# Patient Record
Sex: Male | Born: 1990 | Race: White | Hispanic: No | Marital: Married | Smoking: Never smoker
Health system: Southern US, Community
[De-identification: ages and names within clinical notes are randomized; demographics above are authoritative.]

## PROBLEM LIST (undated history)

## (undated) DIAGNOSIS — N200 Calculus of kidney: Secondary | ICD-10-CM

## (undated) DIAGNOSIS — J45909 Unspecified asthma, uncomplicated: Secondary | ICD-10-CM

## (undated) HISTORY — PX: NO PAST SURGERIES: SHX2092

## (undated) HISTORY — DX: Calculus of kidney: N20.0

---

## 2012-04-16 ENCOUNTER — Emergency Department: Payer: Self-pay | Admitting: Emergency Medicine

## 2012-04-17 LAB — CBC WITH DIFFERENTIAL/PLATELET
Basophil #: 0.1 10*3/uL (ref 0.0–0.1)
Eosinophil #: 0.4 10*3/uL (ref 0.0–0.7)
HCT: 47.1 % (ref 40.0–52.0)
HGB: 16.3 g/dL (ref 13.0–18.0)
Lymphocyte %: 28 %
MCHC: 34.7 g/dL (ref 32.0–36.0)
Neutrophil %: 57 %
RBC: 5.37 10*6/uL (ref 4.40–5.90)
RDW: 13.2 % (ref 11.5–14.5)

## 2012-04-17 LAB — BASIC METABOLIC PANEL
Anion Gap: 8 (ref 7–16)
BUN: 17 mg/dL (ref 7–18)
Calcium, Total: 8.8 mg/dL (ref 8.5–10.1)
Co2: 29 mmol/L (ref 21–32)
Glucose: 83 mg/dL (ref 65–99)
Osmolality: 286 (ref 275–301)
Potassium: 3.7 mmol/L (ref 3.5–5.1)

## 2012-04-23 LAB — CULTURE, BLOOD (SINGLE)

## 2016-08-14 ENCOUNTER — Ambulatory Visit
Admission: EM | Admit: 2016-08-14 | Discharge: 2016-08-14 | Disposition: A | Discharge status: Home / Self Care | Payer: Commercial Managed Care - PPO | Attending: Family Medicine | Admitting: Family Medicine

## 2016-08-14 ENCOUNTER — Encounter: Payer: Self-pay | Admitting: *Deleted

## 2016-08-14 DIAGNOSIS — R0789 Other chest pain: Secondary | ICD-10-CM | POA: Diagnosis not present

## 2016-08-14 DIAGNOSIS — J4520 Mild intermittent asthma, uncomplicated: Secondary | ICD-10-CM | POA: Insufficient documentation

## 2016-08-14 DIAGNOSIS — R079 Chest pain, unspecified: Secondary | ICD-10-CM | POA: Diagnosis present

## 2016-08-14 DIAGNOSIS — M940 Chondrocostal junction syndrome [Tietze]: Secondary | ICD-10-CM | POA: Insufficient documentation

## 2016-08-14 DIAGNOSIS — I10 Essential (primary) hypertension: Secondary | ICD-10-CM | POA: Insufficient documentation

## 2016-08-14 HISTORY — DX: Unspecified asthma, uncomplicated: J45.909

## 2016-08-14 MED ORDER — ALBUTEROL SULFATE HFA 108 (90 BASE) MCG/ACT IN AERS: 1.0000 | INHALATION_SPRAY | Freq: Four times a day (QID) | RESPIRATORY_TRACT | 0 refills | Status: AC | PRN

## 2016-08-14 NOTE — ED Triage Notes (Signed)
Sudden onset left chest pain, sharp in nature, non-radiating, dyspnea, and weakness. BP was checked at work and was 150/90. Onset of pain was yesterday while at work and has continued today.

## 2016-08-14 NOTE — ED Provider Notes (Signed)
[facility]    CSN: [id number] Arrival date & time: 08/14/16  1235     History   Chief Complaint Chief Complaint  Patient presents with  . Chest Pain  . Hypertension    HPI Tyler Holmes is a 26 y.o. male.   The history is provided by the patient.  Chest Pain  Pain location:  L chest Pain quality: sharp and stabbing   Pain radiates to:  Does not radiate Pain severity:  Mild Onset quality:  Sudden Duration:  10 minutes Timing:  Sporadic Progression:  Resolved Chronicity:  New Context: lifting, movement and stress   Context: not breathing and not trauma   Context comment:  Episode while working; "sharp", "stabbing" brief chest pain Relieved by:  None tried Ineffective treatments:  Rest Associated symptoms: no abdominal pain, no AICD problem, no altered mental status, no anorexia, no anxiety, no back pain, no claudication, no cough, no diaphoresis, no dizziness, no dysphagia, no fatigue, no fever, no headache, no heartburn, no lower extremity edema, no nausea, no near-syncope, no numbness, no orthopnea, no palpitations, no PND, no shortness of breath, no syncope, no vomiting and no weakness   Risk factors: no aortic disease, no birth control, no coronary artery disease, no diabetes mellitus, no high cholesterol, no hypertension and no immobilization   Risk factors comment:  Mild intermittent asthma Hypertension  Associated symptoms include chest pain. Pertinent negatives include no abdominal pain, no headaches and no shortness of breath.    Past Medical History:  Diagnosis Date  . Asthma     There are no active problems to display for this patient.   History reviewed. No pertinent surgical history.     Home Medications    Prior to Admission medications   Medication Sig Start Date End Date Taking? Authorizing Provider  albuterol (PROVENTIL HFA;VENTOLIN HFA) 108 (90 Base) MCG/ACT inhaler Inhale 1-2 puffs into the lungs every 6 (six) hours as needed  for wheezing or shortness of breath. 08/14/16   Payton Mccallum, MD    Family History History reviewed. No pertinent family history.  Social History Social History  Substance Use Topics  . Smoking status: Never Smoker  . Smokeless tobacco: Never Used  . Alcohol use No     Allergies   Erythromycin   Review of Systems Review of Systems  Constitutional: Negative for diaphoresis, fatigue and fever.  HENT: Negative for trouble swallowing.   Respiratory: Negative for cough and shortness of breath.   Cardiovascular: Positive for chest pain. Negative for palpitations, orthopnea, claudication, syncope, PND and near-syncope.  Gastrointestinal: Negative for abdominal pain, anorexia, heartburn, nausea and vomiting.  Musculoskeletal: Negative for back pain.  Neurological: Negative for dizziness, weakness, numbness and headaches.     Physical Exam Triage Vital Signs ED Triage Vitals  Enc Vitals Group     BP 08/14/16 1309 107/67     Pulse Rate 08/14/16 1309 73     Resp 08/14/16 1309 16     Temp 08/14/16 1309 97.5 F (36.4 C)     Temp Source 08/14/16 1309 Oral     SpO2 08/14/16 1309 100 %     Weight 08/14/16 1311 150 lb (68 kg)     Height 08/14/16 1311 6\' 1"  (1.854 m)     Head Circumference --      Peak Flow --      Pain Score 08/14/16 1400 4     Pain Loc --      Pain Edu? --  Excl. in GC? --    No data found.   Updated Vital Signs BP 107/67 (BP Location: Left Arm)   Pulse 73   Temp 97.5 F (36.4 C) (Oral)   Resp 16   Ht 6\' 1"  (1.854 m)   Wt 150 lb (68 kg)   SpO2 100%   BMI 19.79 kg/m   Visual Acuity Right Eye Distance:   Left Eye Distance:   Bilateral Distance:    Right Eye Near:   Left Eye Near:    Bilateral Near:     Physical Exam  Constitutional: He appears well-developed and well-nourished. No distress.  HENT:  Head: Normocephalic and atraumatic.  Right Ear: Tympanic membrane, external ear and ear canal normal.  Left Ear: Tympanic membrane,  external ear and ear canal normal.  Nose: Nose normal.  Mouth/Throat: Uvula is midline, oropharynx is clear and moist and mucous membranes are normal. No oropharyngeal exudate or tonsillar abscesses.  Eyes: Conjunctivae and EOM are normal. Pupils are equal, round, and reactive to light. Right eye exhibits no discharge. Left eye exhibits no discharge. No scleral icterus.  Neck: Normal range of motion. Neck supple. No tracheal deviation present. No thyromegaly present.  Cardiovascular: Normal rate, regular rhythm, normal heart sounds and intact distal pulses.   Pulmonary/Chest: Effort normal and breath sounds normal. No stridor. No respiratory distress. He has no wheezes. He has no rales. He exhibits no tenderness.  Lymphadenopathy:    He has no cervical adenopathy.  Neurological: He is alert.  Skin: Skin is warm and dry. No rash noted. He is not diaphoretic.  Nursing note and vitals reviewed.    UC Treatments / Results  Labs (all labs ordered are listed, but only abnormal results are displayed) Labs Reviewed - No data to display  EKG  EKG Interpretation None       Radiology No results found.  Procedures .EKG Date/Time: 08/14/2016 3:37 PM Performed by: Payton Mccallum Authorized by: Payton Mccallum   ECG reviewed by ED Physician in the absence of a cardiologist: yes   Previous ECG:    Previous ECG:  Unavailable Interpretation:    Interpretation: normal   Rate:    ECG rate assessment: normal   Rhythm:    Rhythm: sinus rhythm   Ectopy:    Ectopy: none   QRS:    QRS axis:  Normal Conduction:    Conduction: normal   ST segments:    ST segments:  Normal T waves:    T waves: normal     (including critical care time)  Medications Ordered in UC Medications - No data to display   Initial Impression / Assessment and Plan / UC Course  I have reviewed the triage vital signs and the nursing notes.  Pertinent labs & imaging results that were available during my care of  the patient were reviewed by me and considered in my medical decision making (see chart for details).      Final Clinical Impressions(s) / UC Diagnoses   Final diagnoses:  Atypical chest pain  Mild intermittent asthma without complication  Costochondritis    New Prescriptions Discharge Medication List as of 08/14/2016  1:58 PM    START taking these medications   Details  albuterol (PROVENTIL HFA;VENTOLIN HFA) 108 (90 Base) MCG/ACT inhaler Inhale 1-2 puffs into the lungs every 6 (six) hours as needed for wheezing or shortness of breath., Starting Fri 08/14/2016, Normal       1. ekg result (normal) and diagnosis reviewed with  patient 2. rx as per orders above; reviewed possible side effects, interactions, risks and benefits  3. Recommend supportive treatment with otc analgesics prn  4. Follow-up prn if symptoms worsen or don't improve   Payton Mccallumrlando Ivone Licht, MD 08/14/16 1542

## 2017-03-11 ENCOUNTER — Encounter: Payer: Self-pay | Admitting: Emergency Medicine

## 2017-03-11 ENCOUNTER — Emergency Department: Payer: Commercial Managed Care - PPO

## 2017-03-11 ENCOUNTER — Emergency Department
Admission: EM | Admit: 2017-03-11 | Discharge: 2017-03-11 | Disposition: A | Discharge status: Home / Self Care | Payer: Commercial Managed Care - PPO | Attending: Emergency Medicine | Admitting: Emergency Medicine

## 2017-03-11 DIAGNOSIS — N200 Calculus of kidney: Secondary | ICD-10-CM | POA: Diagnosis not present

## 2017-03-11 DIAGNOSIS — J45909 Unspecified asthma, uncomplicated: Secondary | ICD-10-CM | POA: Diagnosis not present

## 2017-03-11 DIAGNOSIS — R112 Nausea with vomiting, unspecified: Secondary | ICD-10-CM | POA: Insufficient documentation

## 2017-03-11 DIAGNOSIS — R1031 Right lower quadrant pain: Secondary | ICD-10-CM | POA: Diagnosis present

## 2017-03-11 LAB — COMPREHENSIVE METABOLIC PANEL
ALT: 18 U/L (ref 17–63)
AST: 22 U/L (ref 15–41)
Albumin: 4.3 g/dL (ref 3.5–5.0)
Alkaline Phosphatase: 38 U/L (ref 38–126)
Anion gap: 8 (ref 5–15)
BILIRUBIN TOTAL: 0.6 mg/dL (ref 0.3–1.2)
BUN: 21 mg/dL — AB (ref 6–20)
CHLORIDE: 107 mmol/L (ref 101–111)
CO2: 24 mmol/L (ref 22–32)
CREATININE: 1.18 mg/dL (ref 0.61–1.24)
Calcium: 9.1 mg/dL (ref 8.9–10.3)
GFR calc Af Amer: >60 mL/min (ref >60)
Glucose, Bld: 149 mg/dL — ABNORMAL HIGH (ref 65–99)
Potassium: 3.6 mmol/L (ref 3.5–5.1)
Sodium: 139 mmol/L (ref 135–145)
Total Protein: 7.1 g/dL (ref 6.5–8.1)

## 2017-03-11 LAB — URINALYSIS, COMPLETE (UACMP) WITH MICROSCOPIC
BACTERIA UA: NONE SEEN
BILIRUBIN URINE: NEGATIVE
Glucose, UA: NEGATIVE mg/dL
Hgb urine dipstick: NEGATIVE
KETONES UR: NEGATIVE mg/dL
LEUKOCYTES UA: NEGATIVE
Nitrite: NEGATIVE
PH: 6 (ref 5.0–8.0)
PROTEIN: NEGATIVE mg/dL
SQUAMOUS EPITHELIAL / LPF: NONE SEEN
Specific Gravity, Urine: 1.023 (ref 1.005–1.030)

## 2017-03-11 LAB — CBC
HEMATOCRIT: 43.3 % (ref 40.0–52.0)
Hemoglobin: 14.7 g/dL (ref 13.0–18.0)
MCH: 29.4 pg (ref 26.0–34.0)
MCHC: 33.9 g/dL (ref 32.0–36.0)
MCV: 86.5 fL (ref 80.0–100.0)
PLATELETS: 217 10*3/uL (ref 150–440)
RBC: 5.01 MIL/uL (ref 4.40–5.90)
RDW: 13 % (ref 11.5–14.5)
WBC: 11.7 10*3/uL — AB (ref 3.8–10.6)

## 2017-03-11 LAB — LIPASE, BLOOD: Lipase: 41 U/L (ref 11–51)

## 2017-03-11 MED ORDER — TAMSULOSIN HCL 0.4 MG PO CAPS
0.4000 mg | ORAL_CAPSULE | Freq: Once | ORAL | Status: AC
  Administered 2017-03-11: 0.4 mg via ORAL
  Filled 2017-03-11: qty 1

## 2017-03-11 MED ORDER — IOPAMIDOL (ISOVUE-300) INJECTION 61%
100.0000 mL | Freq: Once | INTRAVENOUS | Status: AC | PRN
  Administered 2017-03-11: 100 mL via INTRAVENOUS

## 2017-03-11 MED ORDER — SODIUM CHLORIDE 0.9 % IV BOLUS (SEPSIS)
1000.0000 mL | Freq: Once | INTRAVENOUS | Status: AC
  Administered 2017-03-11: 1000 mL via INTRAVENOUS

## 2017-03-11 MED ORDER — ONDANSETRON 4 MG PO TBDP: 4.0000 mg | ORAL_TABLET | Freq: Three times a day (TID) | ORAL | 0 refills | Status: AC | PRN

## 2017-03-11 MED ORDER — ONDANSETRON HCL 4 MG/2ML IJ SOLN
4.0000 mg | Freq: Once | INTRAMUSCULAR | Status: AC
  Administered 2017-03-11: 4 mg via INTRAVENOUS
  Filled 2017-03-11: qty 2

## 2017-03-11 MED ORDER — KETOROLAC TROMETHAMINE 30 MG/ML IJ SOLN
10.0000 mg | Freq: Once | INTRAMUSCULAR | Status: AC
  Administered 2017-03-11: 9.9 mg via INTRAVENOUS
  Filled 2017-03-11: qty 1

## 2017-03-11 MED ORDER — ONDANSETRON HCL 4 MG/2ML IJ SOLN
4.0000 mg | Freq: Once | INTRAMUSCULAR | Status: AC
  Administered 2017-03-11: 4 mg via INTRAVENOUS

## 2017-03-11 MED ORDER — ONDANSETRON HCL 4 MG/2ML IJ SOLN
INTRAMUSCULAR | Status: AC
  Administered 2017-03-11: 4 mg via INTRAVENOUS
  Filled 2017-03-11: qty 2

## 2017-03-11 MED ORDER — MORPHINE SULFATE (PF) 4 MG/ML IV SOLN
4.0000 mg | Freq: Once | INTRAVENOUS | Status: AC
  Administered 2017-03-11: 4 mg via INTRAVENOUS
  Filled 2017-03-11: qty 1

## 2017-03-11 MED ORDER — OXYCODONE-ACETAMINOPHEN 5-325 MG PO TABS: 1.0000 | ORAL_TABLET | ORAL | 0 refills | Status: AC | PRN

## 2017-03-11 MED ORDER — IBUPROFEN 800 MG PO TABS: 800.0000 mg | ORAL_TABLET | Freq: Three times a day (TID) | ORAL | 0 refills | Status: AC | PRN

## 2017-03-11 MED ORDER — TAMSULOSIN HCL 0.4 MG PO CAPS: 0.4000 mg | ORAL_CAPSULE | Freq: Every day | ORAL | 0 refills | Status: AC

## 2017-03-11 MED ORDER — IOPAMIDOL (ISOVUE-300) INJECTION 61%
30.0000 mL | Freq: Once | INTRAVENOUS | Status: AC | PRN
  Administered 2017-03-11: 30 mL via ORAL

## 2017-03-11 NOTE — ED Notes (Signed)
Patient transported to CT 

## 2017-03-11 NOTE — ED Notes (Signed)
Pt reports nausea. MD made aware.

## 2017-03-11 NOTE — Discharge Instructions (Signed)
1. Take pain & nausea medicines as needed (Percocet/Zofran #30). Make sure to take a stool softener while taking narcotic pain medicines. 2. Take Flomax 0.4mg daily x 14 days. 3. Drink plenty of bottled or filtered water daily. 4. Return to the ER for worsening symptoms, persistent vomiting, fever, difficulty breathing or other concerns.  

## 2017-03-11 NOTE — ED Provider Notes (Signed)
[facility] Emergency Department Provider Note   ____________________________________________   First MD Initiated Contact with Patient 03/11/17 [id number]     (approximate)  I have reviewed the triage vital signs and the nursing notes.   HISTORY  Chief Complaint Abdominal Pain    HPI Tyler Holmes is a 26 y.o. male who presents to the ED from home with a chief complaint of abdominal pain and vomiting. Patient reports gradual onset right lower quadrant abdominal pain approximately 5 PM before eating. Symptoms accompanied by nausea/vomiting several times. Last bowel movement prior to onset of symptoms but patient feels constipated. Denies similar symptoms previously. Denies associated fever, chills, chest pain, shortness of breath, dysuria, diarrhea. Denies testicular pain or swelling. Denies recent travel or trauma. Nothing makes his symptoms better or worse.   Past Medical History:  Diagnosis Date  . Asthma     There are no active problems to display for this patient.   History reviewed. No pertinent surgical history.  Prior to Admission medications   Medication Sig Start Date End Date Taking? Authorizing Provider  albuterol (PROVENTIL HFA;VENTOLIN HFA) 108 (90 Base) MCG/ACT inhaler Inhale 1-2 puffs into the lungs every 6 (six) hours as needed for wheezing or shortness of breath. 08/14/16   Payton Mccallum, MD  ibuprofen (ADVIL,MOTRIN) 800 MG tablet Take 1 tablet (800 mg total) by mouth every 8 (eight) hours as needed for moderate pain. 03/11/17   Irean Hong, MD  ondansetron (ZOFRAN ODT) 4 MG disintegrating tablet Take 1 tablet (4 mg total) by mouth every 8 (eight) hours as needed for nausea or vomiting. 03/11/17   Irean Hong, MD  oxyCODONE-acetaminophen (ROXICET) 5-325 MG tablet Take 1 tablet by mouth every 4 (four) hours as needed for severe pain. 03/11/17   Irean Hong, MD  tamsulosin (FLOMAX) 0.4 MG CAPS capsule Take 1 capsule (0.4 mg total) by mouth  daily. 03/11/17   Irean Hong, MD    Allergies Erythromycin  No family history on file.  Social History Social History  Substance Use Topics  . Smoking status: Never Smoker  . Smokeless tobacco: Never Used  . Alcohol use No    Review of Systems  Constitutional: No fever/chills. Eyes: No visual changes. ENT: No sore throat. Cardiovascular: Denies chest pain. Respiratory: Denies shortness of breath. Gastrointestinal: Positive for abdominal pain, nausea and vomiting.  No diarrhea.  No constipation. Genitourinary: Negative for dysuria. Musculoskeletal: Negative for back pain. Skin: Negative for rash. Neurological: Negative for headaches, focal weakness or numbness.   ____________________________________________   PHYSICAL EXAM:  VITAL SIGNS: ED Triage Vitals  Enc Vitals Group     BP 03/11/17 0226 114/69     Pulse Rate 03/11/17 0226 81     Resp 03/11/17 0226 18     Temp 03/11/17 0226 98.6 F (37 C)     Temp src --      SpO2 03/11/17 0226 98 %     Weight 03/11/17 0225 163 lb (73.9 kg)     Height 03/11/17 0225 6\' 1"  (1.854 m)     Head Circumference --      Peak Flow --      Pain Score 03/11/17 0225 10     Pain Loc --      Pain Edu? --      Excl. in GC? --     Constitutional: Alert and oriented. Well appearing and in mild acute distress. Eyes: Conjunctivae are normal. PERRL. EOMI. Head: Atraumatic. Nose: No  congestion/rhinnorhea. Mouth/Throat: Mucous membranes are moist.  Oropharynx non-erythematous. Neck: No stridor.   Cardiovascular: Normal rate, regular rhythm. Grossly normal heart sounds.  Good peripheral circulation. Respiratory: Normal respiratory effort.  No retractions. Lungs CTAB. Gastrointestinal: Soft and mildly tender to palpation right lower, mid and upper quadrant without rebound or guarding. No distention. No abdominal bruits. No CVA tenderness. Musculoskeletal: No lower extremity tenderness nor edema.  No joint effusions. Neurologic:  Normal  speech and language. No gross focal neurologic deficits are appreciated.  Skin:  Skin is pale, warm, dry and intact. No rash noted. Psychiatric: Mood and affect are normal. Speech and behavior are normal.  ____________________________________________   LABS (all labs ordered are listed, but only abnormal results are displayed)  Labs Reviewed  COMPREHENSIVE METABOLIC PANEL - Abnormal; Notable for the following:       Result Value   Glucose, Bld 149 (*)    BUN 21 (*)    All other components within normal limits  CBC - Abnormal; Notable for the following:    WBC 11.7 (*)    All other components within normal limits  URINALYSIS, COMPLETE (UACMP) WITH MICROSCOPIC - Abnormal; Notable for the following:    Color, Urine YELLOW (*)    APPearance CLEAR (*)    All other components within normal limits  LIPASE, BLOOD   ____________________________________________  EKG  none ____________________________________________  RADIOLOGY  Ct Abdomen Pelvis W Contrast  Result Date: 03/11/2017 CLINICAL DATA:  Right-sided abdominal pain.  Nausea. EXAM: CT ABDOMEN AND PELVIS WITH CONTRAST TECHNIQUE: Multidetector CT imaging of the abdomen and pelvis was performed using the standard protocol following bolus administration of intravenous contrast. CONTRAST:  ISOVUE-300 IOPAMIDOL (ISOVUE-300) INJECTION 61% COMPARISON:  None. FINDINGS: Lower chest: The lung bases are clear. Hepatobiliary: Periportal edema may be secondary to hydration status. Gallbladder is partially distended. Minimal gallbladder wall thickening or pericholecystic fluid. No calcified gallstone. No biliary dilatation. Pancreas: No ductal dilatation or inflammation. Spleen: Normal in size without focal abnormality. Splenule at the hilum. Adrenals/Urinary Tract: Obstructing 4 mm stone at the right ureterovesicular junction with moderate hydroureteronephrosis and perinephric edema. Additional punctate nonobstructing stones in the right  kidney. No left hydronephrosis. No definite left urolithiasis. Urinary bladder is partially distended without stone. Stomach/Bowel: Stomach is within normal limits. Appendix appears normal. No evidence of bowel wall thickening, distention, or inflammatory changes. Vascular/Lymphatic: The IVC is distended. Normal caliber abdominal aorta. Accessory left renal artery is incidentally noted. No enlarged abdominal or pelvic lymph nodes. Reproductive: Prostate is unremarkable. Other: No free air, free fluid, or intra-abdominal fluid collection. Musculoskeletal: There are no acute or suspicious osseous abnormalities. IMPRESSION: 1. Obstructing 4 mm stone at the right ureterovesicular junction with moderate hydroureteronephrosis. 2. Additional nonobstructing stones in the right kidney. 3. Periportal edema. Nondistended gallbladder with wall thickening or pericholecystic fluid. This may be secondary to hydration status, however recommend correlation with LFTs. Electronically Signed   By: Rubye Oaks M.D.   On: 03/11/2017 04:49    ____________________________________________   PROCEDURES  Procedure(s) performed: None  Procedures  Critical Care performed: No  ____________________________________________   INITIAL IMPRESSION / ASSESSMENT AND PLAN / ED COURSE  Pertinent labs & imaging results that were available during my care of the patient were reviewed by me and considered in my medical decision making (see chart for details).  26 year old male who presents with right sided abdominal pain associated with nausea and vomiting. Laboratory results demonstrate mild leukocytosis. Awaiting urinalysis. Will proceed with CT abdomen/pelvis to evaluate  for intra-abdominal infection.  Clinical Course as of Mar 11 542  Thu Mar 11, 2017  0533 Updated patient of urinalysis and CT imaging results. Pain decreased to 5/10. Feel non-distended gallbladder with wall thickening most likely consistent with hydration  status given that patient had several episodes of vomiting. Finding is less likely consistent with cholecystitis. Patient was able to tolerate oral contrast without emesis. Strict return precautions given. Patient verbalizes understanding and agrees with plan of care.  [JS]    Clinical Course User Index [JS] Irean Hong, MD     ____________________________________________   FINAL CLINICAL IMPRESSION(S) / ED DIAGNOSES  Final diagnoses:  Right lower quadrant abdominal pain  Non-intractable vomiting with nausea, unspecified vomiting type  Kidney stone      NEW MEDICATIONS STARTED DURING THIS VISIT:  New Prescriptions   IBUPROFEN (ADVIL,MOTRIN) 800 MG TABLET    Take 1 tablet (800 mg total) by mouth every 8 (eight) hours as needed for moderate pain.   ONDANSETRON (ZOFRAN ODT) 4 MG DISINTEGRATING TABLET    Take 1 tablet (4 mg total) by mouth every 8 (eight) hours as needed for nausea or vomiting.   OXYCODONE-ACETAMINOPHEN (ROXICET) 5-325 MG TABLET    Take 1 tablet by mouth every 4 (four) hours as needed for severe pain.   TAMSULOSIN (FLOMAX) 0.4 MG CAPS CAPSULE    Take 1 capsule (0.4 mg total) by mouth daily.     Note:  This document was prepared using Dragon voice recognition software and may include unintentional dictation errors.    Irean Hong, MD 03/11/17 [id number]

## 2017-03-11 NOTE — ED Triage Notes (Signed)
Pt to triage via w/c with no distress noted; pt reports right lower abd pain since 5pm accomp by nausea; denies hx of same

## 2018-07-26 ENCOUNTER — Ambulatory Visit (INDEPENDENT_AMBULATORY_CARE_PROVIDER_SITE_OTHER): Payer: Commercial Managed Care - PPO

## 2018-07-26 ENCOUNTER — Ambulatory Visit
Admission: EM | Admit: 2018-07-26 | Discharge: 2018-07-26 | Disposition: A | Discharge status: Home / Self Care | Payer: Commercial Managed Care - PPO | Attending: Family Medicine | Admitting: Family Medicine

## 2018-07-26 ENCOUNTER — Other Ambulatory Visit: Payer: Self-pay

## 2018-07-26 ENCOUNTER — Encounter: Payer: Self-pay | Admitting: Emergency Medicine

## 2018-07-26 DIAGNOSIS — S46912A Strain of unspecified muscle, fascia and tendon at shoulder and upper arm level, left arm, initial encounter: Secondary | ICD-10-CM | POA: Diagnosis not present

## 2018-07-26 DIAGNOSIS — S161XXA Strain of muscle, fascia and tendon at neck level, initial encounter: Secondary | ICD-10-CM

## 2018-07-26 MED ORDER — CYCLOBENZAPRINE HCL 10 MG PO TABS: 10.0000 mg | ORAL_TABLET | Freq: Every day | ORAL | 0 refills | Status: AC
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
## 2018-07-26 NOTE — Discharge Instructions (Signed)
Rest, heat/ice, over the counter tylenol/motrin
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
## 2018-08-08 ENCOUNTER — Ambulatory Visit
Admission: EM | Admit: 2018-08-08 | Discharge: 2018-08-08 | Disposition: A | Discharge status: Home / Self Care | Payer: Commercial Managed Care - PPO | Attending: Family Medicine | Admitting: Family Medicine

## 2018-08-08 ENCOUNTER — Ambulatory Visit (INDEPENDENT_AMBULATORY_CARE_PROVIDER_SITE_OTHER): Payer: Commercial Managed Care - PPO

## 2018-08-08 DIAGNOSIS — M542 Cervicalgia: Secondary | ICD-10-CM

## 2018-08-08 DIAGNOSIS — S46912D Strain of unspecified muscle, fascia and tendon at shoulder and upper arm level, left arm, subsequent encounter: Secondary | ICD-10-CM

## 2018-08-08 DIAGNOSIS — M25512 Pain in left shoulder: Secondary | ICD-10-CM | POA: Diagnosis not present

## 2018-08-08 MED ORDER — MELOXICAM 15 MG PO TABS: 15.0000 mg | ORAL_TABLET | Freq: Every day | ORAL | 0 refills | Status: AC

## 2018-08-08 NOTE — Discharge Instructions (Signed)
Recommend follow up with orthopedist for further evaluation and management
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
## 2020-01-22 ENCOUNTER — Other Ambulatory Visit: Payer: Self-pay

## 2020-01-22 ENCOUNTER — Emergency Department: Payer: Commercial Managed Care - PPO

## 2020-01-22 ENCOUNTER — Emergency Department
Admission: EM | Admit: 2020-01-22 | Discharge: 2020-01-22 | Disposition: A | Discharge status: Home / Self Care | Payer: Commercial Managed Care - PPO | Attending: Emergency Medicine | Admitting: Emergency Medicine

## 2020-01-22 ENCOUNTER — Encounter: Payer: Self-pay | Admitting: Emergency Medicine

## 2020-01-22 DIAGNOSIS — N23 Unspecified renal colic: Secondary | ICD-10-CM | POA: Insufficient documentation

## 2020-01-22 DIAGNOSIS — R109 Unspecified abdominal pain: Secondary | ICD-10-CM | POA: Diagnosis present

## 2020-01-22 DIAGNOSIS — J45909 Unspecified asthma, uncomplicated: Secondary | ICD-10-CM | POA: Diagnosis not present

## 2020-01-22 LAB — BASIC METABOLIC PANEL
Anion gap: 8 (ref 5–15)
BUN: 16 mg/dL (ref 6–20)
CO2: 24 mmol/L (ref 22–32)
Calcium: 8.8 mg/dL — ABNORMAL LOW (ref 8.9–10.3)
Chloride: 101 mmol/L (ref 98–111)
Creatinine, Ser: 1.25 mg/dL — ABNORMAL HIGH (ref 0.61–1.24)
GFR calc Af Amer: >60 mL/min (ref >60)
GFR calc non Af Amer: >60 mL/min (ref >60)
Glucose, Bld: 133 mg/dL — ABNORMAL HIGH (ref 70–99)
Potassium: 3.8 mmol/L (ref 3.5–5.1)
Sodium: 133 mmol/L — ABNORMAL LOW (ref 135–145)

## 2020-01-22 LAB — URINALYSIS, COMPLETE (UACMP) WITH MICROSCOPIC
Bacteria, UA: NONE SEEN
Bilirubin Urine: NEGATIVE
Glucose, UA: 50 mg/dL — AB
Hgb urine dipstick: NEGATIVE
Ketones, ur: NEGATIVE mg/dL
Leukocytes,Ua: NEGATIVE
Nitrite: NEGATIVE
Protein, ur: 30 mg/dL — AB
Specific Gravity, Urine: 1.032 — ABNORMAL HIGH (ref 1.005–1.030)
pH: 6 (ref 5.0–8.0)

## 2020-01-22 LAB — CBC
HCT: 43.2 % (ref 39.0–52.0)
Hemoglobin: 15.3 g/dL (ref 13.0–17.0)
MCH: 29.5 pg (ref 26.0–34.0)
MCHC: 35.4 g/dL (ref 30.0–36.0)
MCV: 83.2 fL (ref 80.0–100.0)
Platelets: 227 10*3/uL (ref 150–400)
RBC: 5.19 MIL/uL (ref 4.22–5.81)
RDW: 12.2 % (ref 11.5–15.5)
WBC: 15.3 10*3/uL — ABNORMAL HIGH (ref 4.0–10.5)
nRBC: 0 % (ref 0.0–0.2)

## 2020-01-22 MED ORDER — FENTANYL CITRATE (PF) 100 MCG/2ML IJ SOLN
50.0000 ug | INTRAMUSCULAR | Status: DC | PRN
  Administered 2020-01-22: 50 ug via INTRAVENOUS
  Filled 2020-01-22: qty 2

## 2020-01-22 MED ORDER — SODIUM CHLORIDE 0.9 % IV SOLN: Freq: Once | INTRAVENOUS | Status: AC

## 2020-01-22 MED ORDER — OXYCODONE-ACETAMINOPHEN 5-325 MG PO TABS: 1.0000 | ORAL_TABLET | Freq: Three times a day (TID) | ORAL | 0 refills | Status: AC | PRN

## 2020-01-22 MED ORDER — TAMSULOSIN HCL 0.4 MG PO CAPS
0.4000 mg | ORAL_CAPSULE | Freq: Every day | ORAL | 0 refills | Status: AC
Start: 2020-01-22 — End: ?

## 2020-01-22 MED ORDER — ONDANSETRON HCL 4 MG PO TABS: 4.0000 mg | ORAL_TABLET | Freq: Every day | ORAL | 1 refills | Status: AC | PRN

## 2020-01-22 MED ORDER — KETOROLAC TROMETHAMINE 30 MG/ML IJ SOLN
30.0000 mg | Freq: Once | INTRAMUSCULAR | Status: AC
  Administered 2020-01-22: 14:00:00 30 mg via INTRAVENOUS
  Filled 2020-01-22: qty 1

## 2020-01-22 MED ORDER — ONDANSETRON HCL 4 MG/2ML IJ SOLN
4.0000 mg | Freq: Once | INTRAMUSCULAR | Status: AC
  Administered 2020-01-22: 12:00:00 4 mg via INTRAVENOUS
  Filled 2020-01-22: qty 2

## 2020-01-22 NOTE — ED Triage Notes (Signed)
Pt reports wife dropped off

## 2020-01-22 NOTE — ED Provider Notes (Addendum)
ER Provider Note       Time seen: 1:27 PM    I have reviewed the vital signs and the nursing notes.  HISTORY   Chief Complaint Flank Pain    HPI Tyler Holmes is a 29 y.o. male with a history of asthma, kidney stones who presents today for right flank pain that radiates into his groin that started today.  Patient states he feels like the last kidney stone that he had.  It started today at 5 AM.  Pain is currently 7 out of 10.  Past Medical History:  Diagnosis Date  . Asthma   . Kidney stone     Past Surgical History:  Procedure Laterality Date  . NO PAST SURGERIES      Allergies Erythromycin  Review of Systems Constitutional: Negative for fever. Cardiovascular: Negative for chest pain. Respiratory: Negative for shortness of breath. Gastrointestinal: Positive for flank pain, vomiting Musculoskeletal: Positive for recent back pain Skin: Negative for rash. Neurological: Negative for headaches, focal weakness or numbness.  All systems negative/normal/unremarkable except as stated in the HPI  ____________________________________________   PHYSICAL EXAM:  VITAL SIGNS: Vitals:   01/22/20 1209  BP: (!) 134/91  Pulse: 77  Resp: 18  Temp: 98.4 F (36.9 C)  SpO2: 100%    Constitutional: Alert and oriented. Well appearing and in no distress. Eyes: Conjunctivae are injected, Normal extraocular movements. ENT      Head: Normocephalic and atraumatic.      Nose: No congestion/rhinnorhea.      Mouth/Throat: Mucous membranes are moist.      Neck: No stridor. Cardiovascular: Normal rate, regular rhythm. No murmurs, rubs, or gallops. Respiratory: Normal respiratory effort without tachypnea nor retractions. Breath sounds are clear and equal bilaterally. No wheezes/rales/rhonchi. Gastrointestinal: Soft and nontender. Normal bowel sounds Musculoskeletal: Nontender with normal range of motion in extremities. No lower extremity tenderness nor edema. Neurologic:  Normal  speech and language. No gross focal neurologic deficits are appreciated.  Skin:  Skin is warm, dry and intact. No rash noted. Psychiatric: Speech and behavior are normal.  ____________________________________________   LABS (pertinent positives/negatives)  Labs Reviewed  URINALYSIS, COMPLETE (UACMP) WITH MICROSCOPIC - Abnormal; Notable for the following components:      Result Value   Color, Urine YELLOW (*)    APPearance CLEAR (*)    Specific Gravity, Urine 1.032 (*)    Glucose, UA 50 (*)    Protein, ur 30 (*)    All other components within normal limits  BASIC METABOLIC PANEL - Abnormal; Notable for the following components:   Sodium 133 (*)    Glucose, Bld 133 (*)    Creatinine, Ser 1.25 (*)    Calcium 8.8 (*)    All other components within normal limits  CBC - Abnormal; Notable for the following components:   WBC 15.3 (*)    All other components within normal limits    RADIOLOGY  Images were viewed by me KUB Distal right ureteral stone IMPRESSION: Right hydronephrosis due to obstruction by a 4 mm stone in the distal right ureter. The  DIFFERENTIAL DIAGNOSIS  Renal colic, UTI, pyelonephritis, muscle strain  ASSESSMENT AND PLAN  Renal colic   Plan: The patient had presented for right flank pain. Patient's labs revealed leukocytosis and a mildly elevated creatinine but were otherwise unremarkable.  Patient was given IV fluids as well as IV Toradol which improved his symptoms.  He be discharged home with Flomax, pain medicine and antiemetics.  He will be  referred to urology for outpatient follow-up.  Daryel November MD    Note: This note was generated in part or whole with voice recognition software. Voice recognition is usually quite accurate but there are transcription errors that can and very often do occur. I apologize for any typographical errors that were not detected and corrected.     Emily Filbert, MD 01/22/20 1328    Emily Filbert,  MD 01/22/20 1455

## 2020-01-22 NOTE — ED Triage Notes (Signed)
Here for right flank pain radiating to groin that feels like last kidney stone he had.  Denies known hematuria. + vomiting r/t pain.  No fever. Started today 5 am.  VSS.

## 2021-06-01 IMAGING — CR DG ABDOMEN 1V
1 series · 2 of 2 positions shown · non-contrast
Comparison: CT of abdomen and pelvis on 03/11/2017

CLINICAL DATA: Flank pain.  History of asthma and kidney stones.

EXAM:
ABDOMEN - 1 VIEW

[Series 1: dg abd 1 view · 0.14mm/px · 2 of 2 slices shown]
[im 1/2]
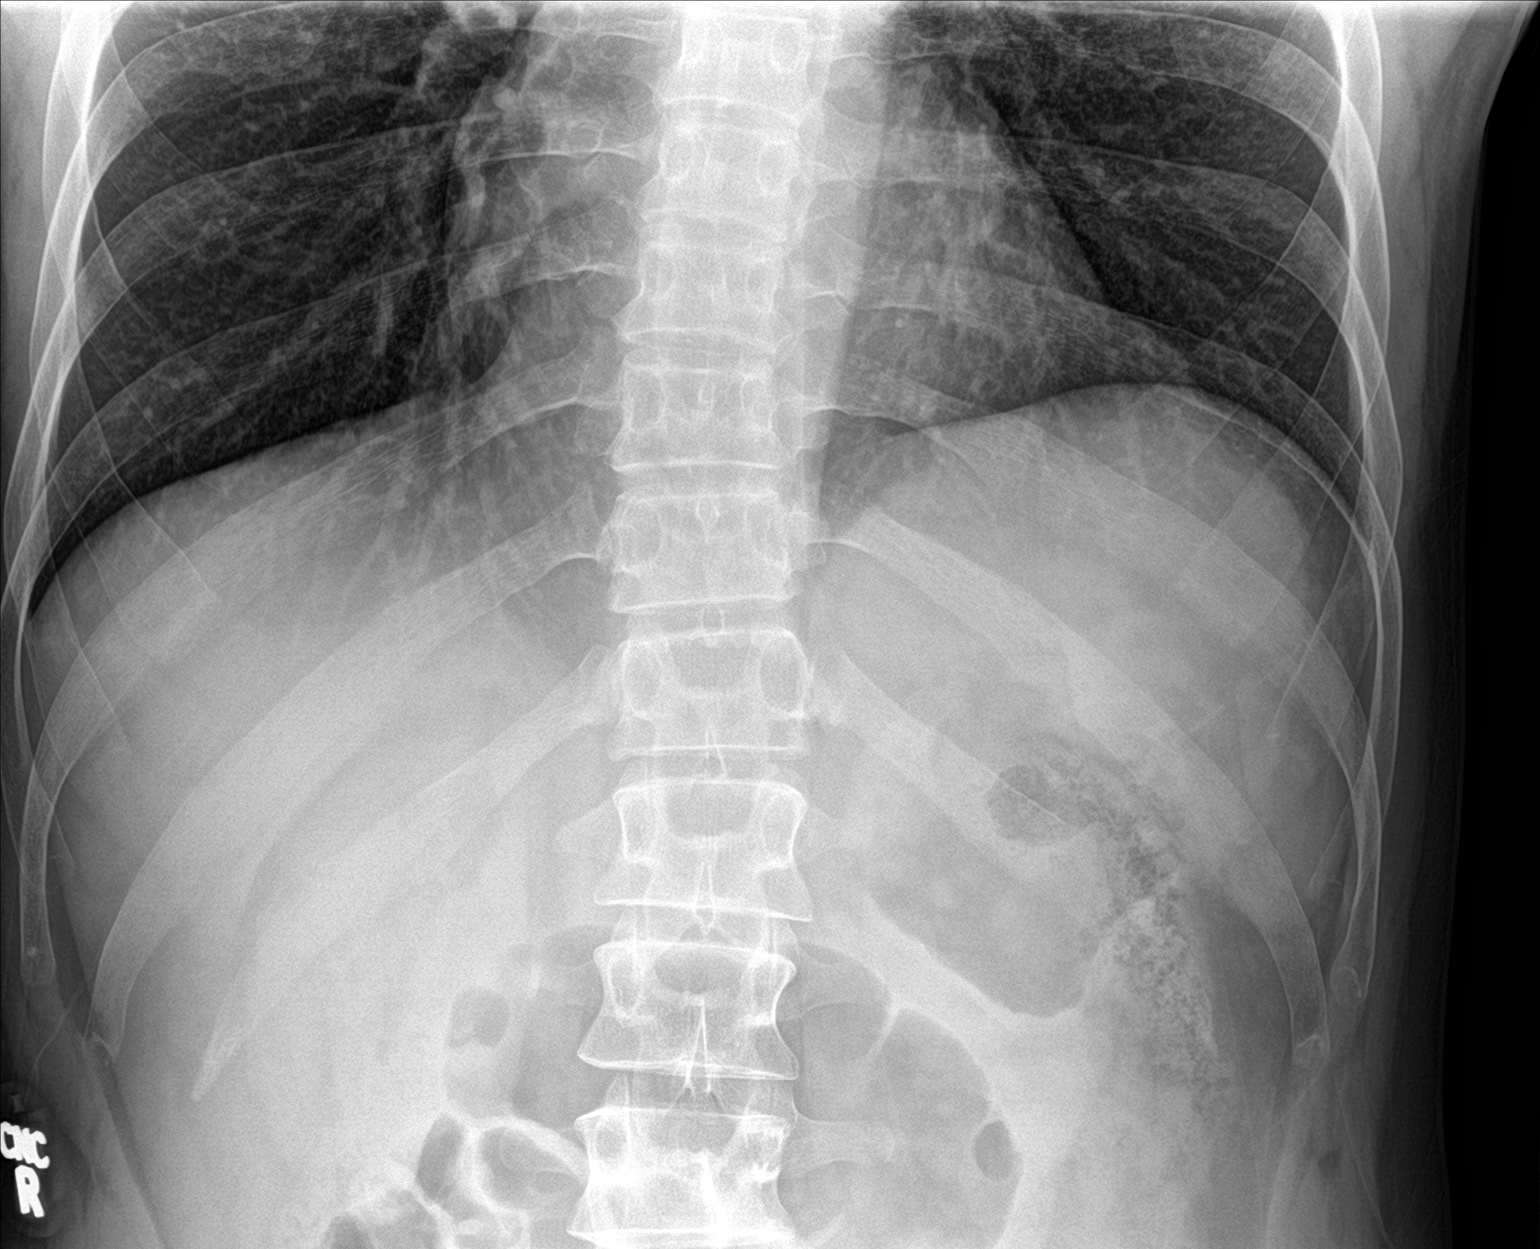
[im 2/2]
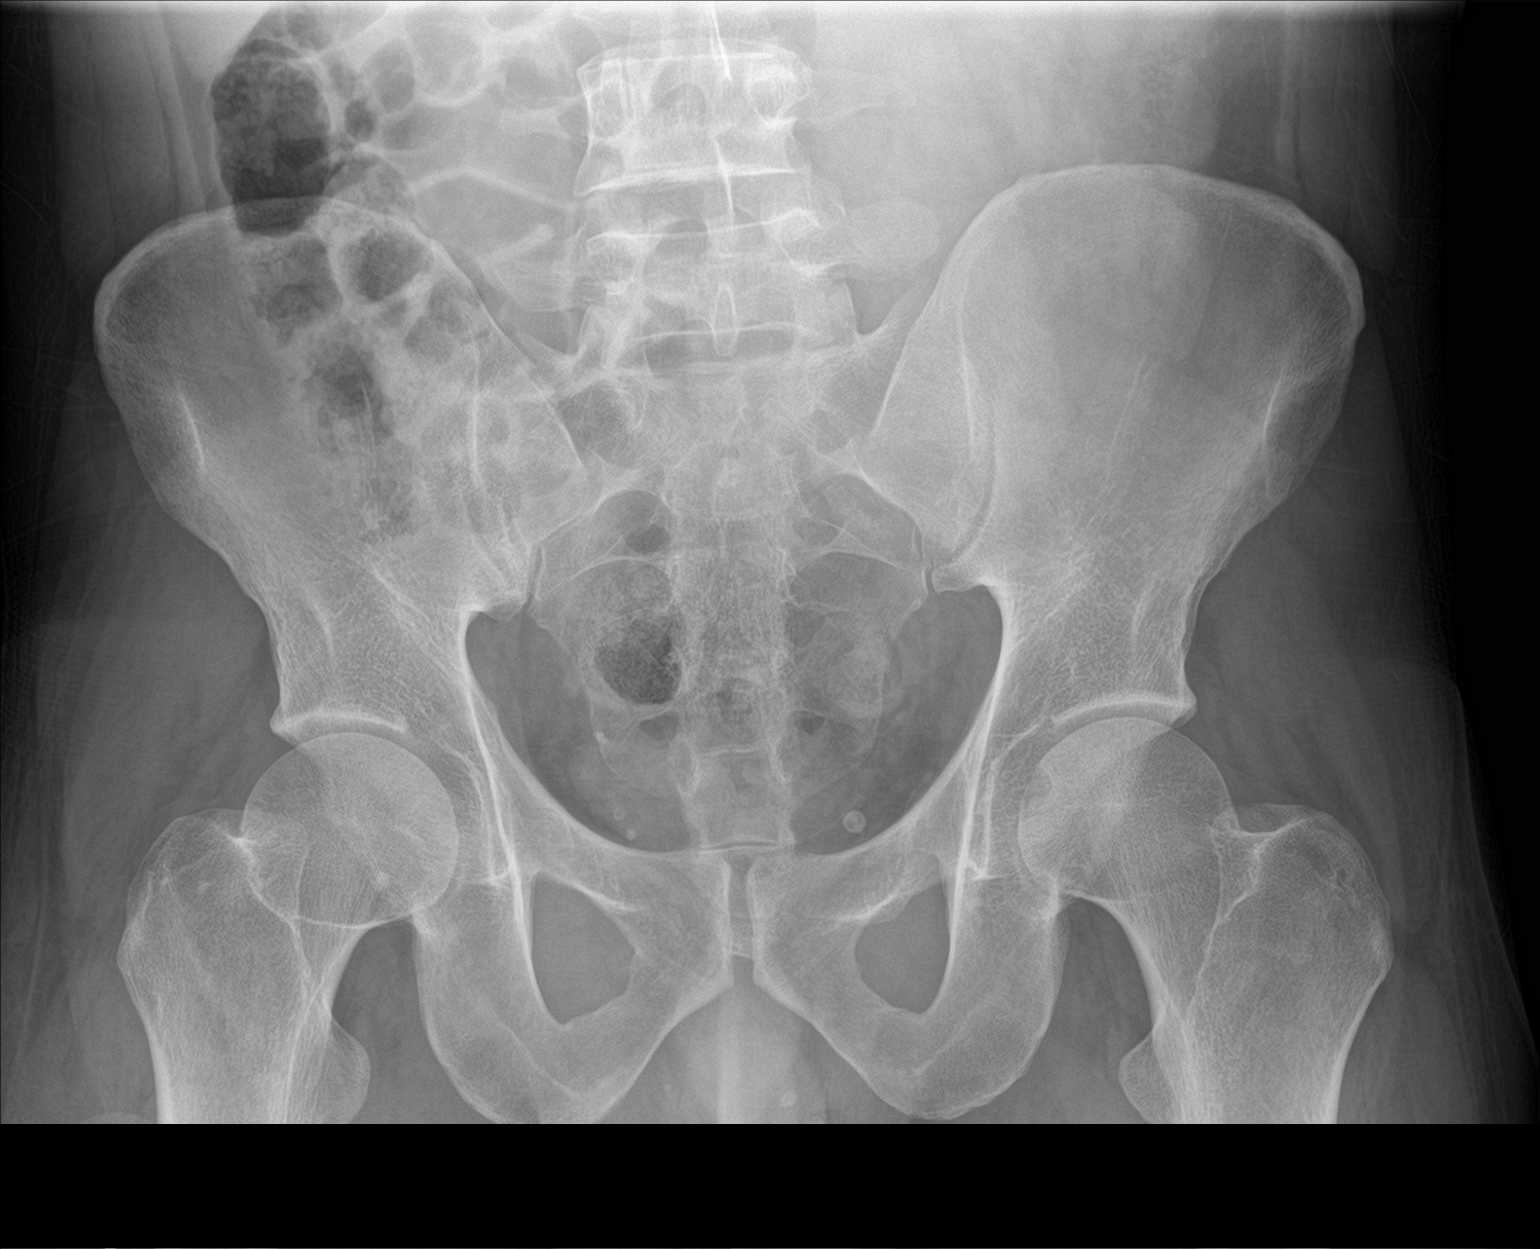

[2 of 2 positions shown; findings below may reference images not displayed]

FINDINGS: Bowel gas pattern is nonobstructed. Phleboliths are identified
within the pelvis. No calcifications identified in the expected
locations of the kidneys or ureters. Visualized osseous structures
have a normal appearance.
IMPRESSION: Negative.

## 2021-06-01 IMAGING — CT CT RENAL STONE PROTOCOL
3 of 4 series · 9 of 46 positions shown, 16 images · non-contrast
Comparison: March 11, 2017

CLINICAL DATA: Right flank pain today.

EXAM:
CT ABDOMEN AND PELVIS WITHOUT CONTRAST
TECHNIQUE: Multidetector CT imaging of the abdomen and pelvis was performed
following the standard protocol without IV contrast.

[Series 4: lung bases · axial · 0.71mm/px · z∈[+439,+529]mm · 5 of 28 slices shown, 10 images]
[im 5/28  soft-tissue]
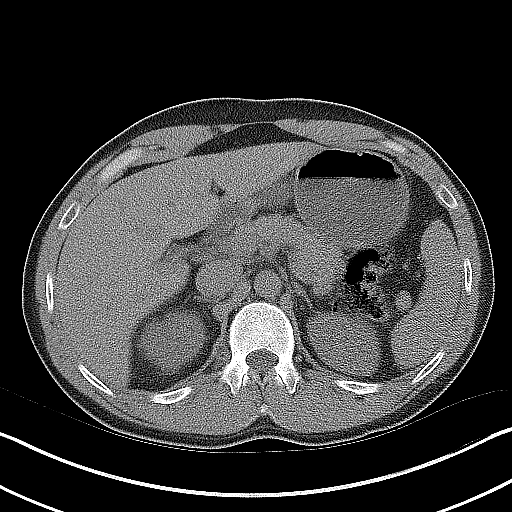
[im 5/28  bone]
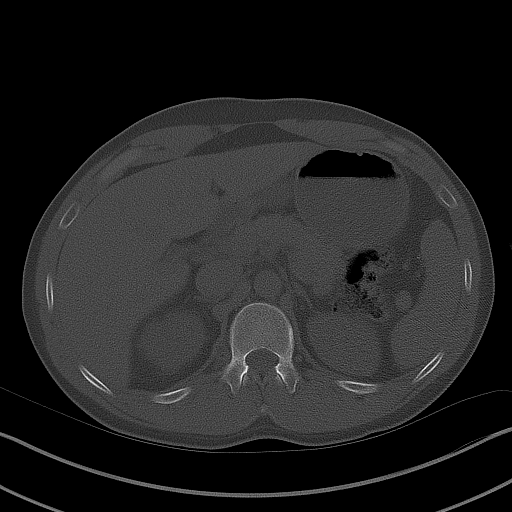
[im 10/28  soft-tissue]
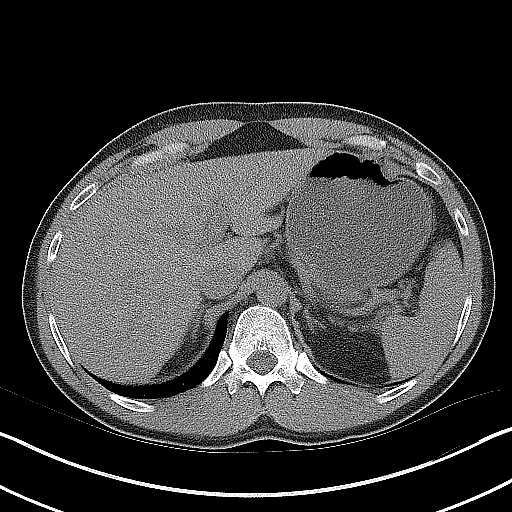
[im 10/28  lung]
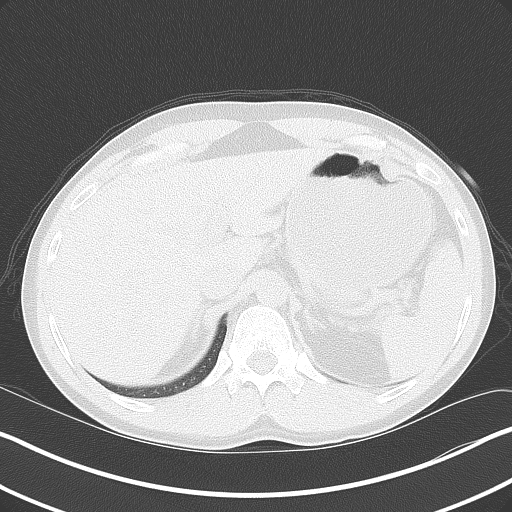
[im 14/28  soft-tissue]
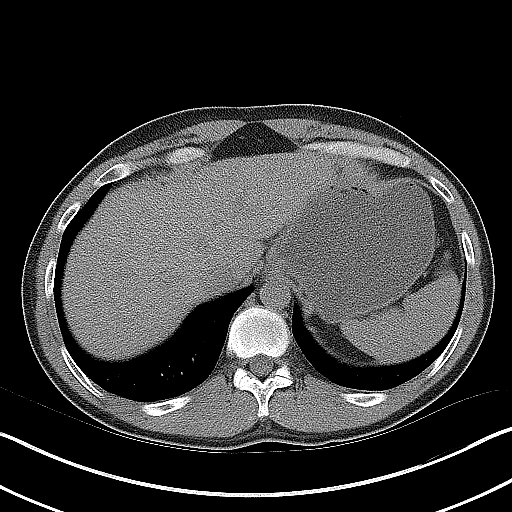
[im 14/28  lung]
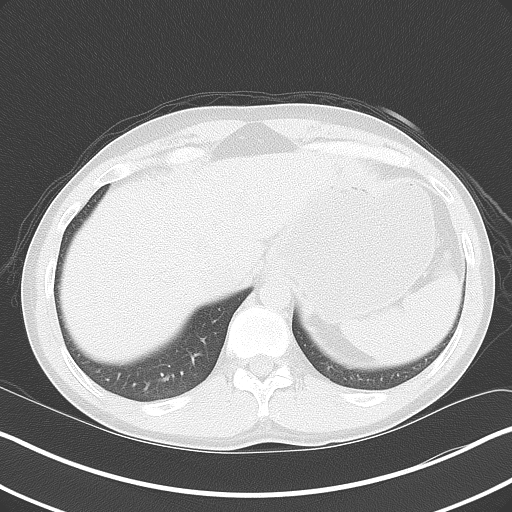
[im 19/28  soft-tissue]
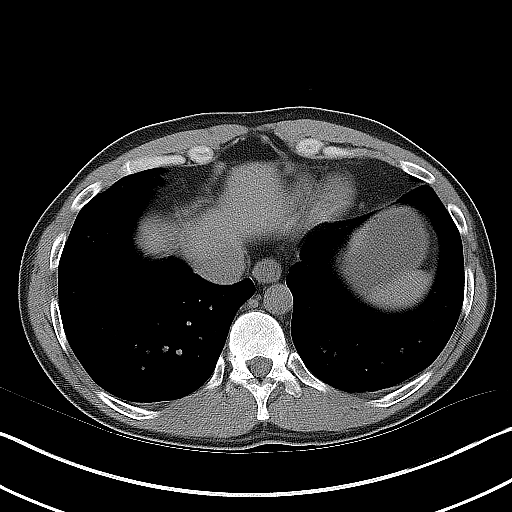
[im 19/28  lung]
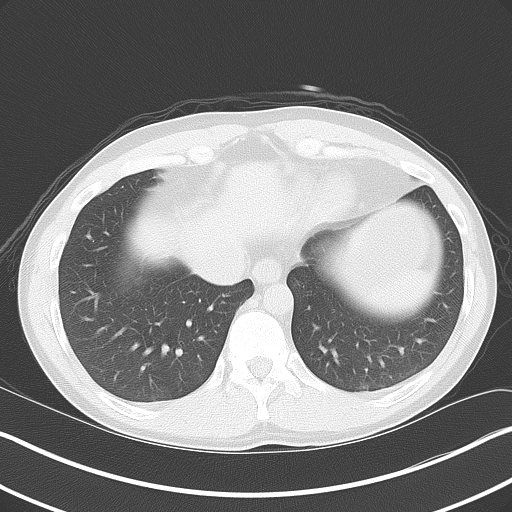
[im 23/28  soft-tissue]
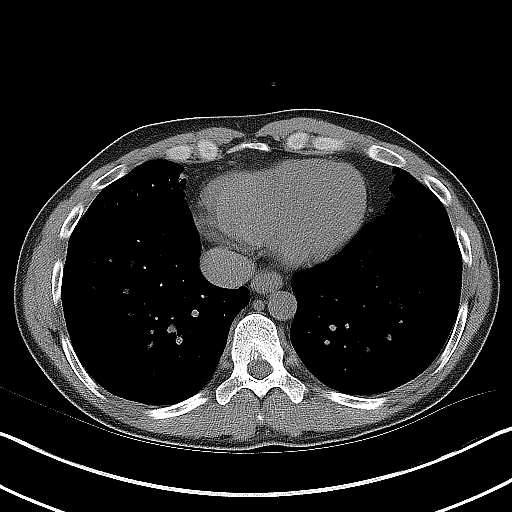
[im 23/28  lung]
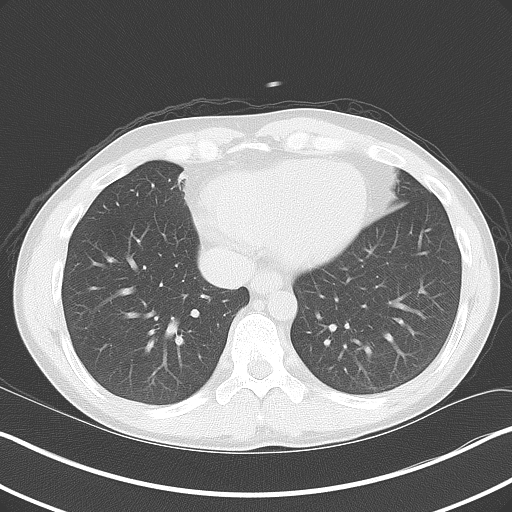

[Series 5: coronal · coronal · 0.73mm/px · 3 of 128 slices shown, 4 images]
[im 43/128  soft-tissue]
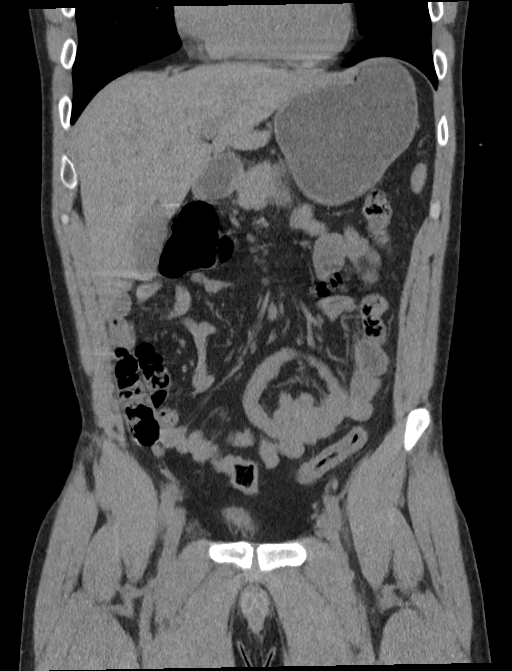
[im 57/128  soft-tissue]
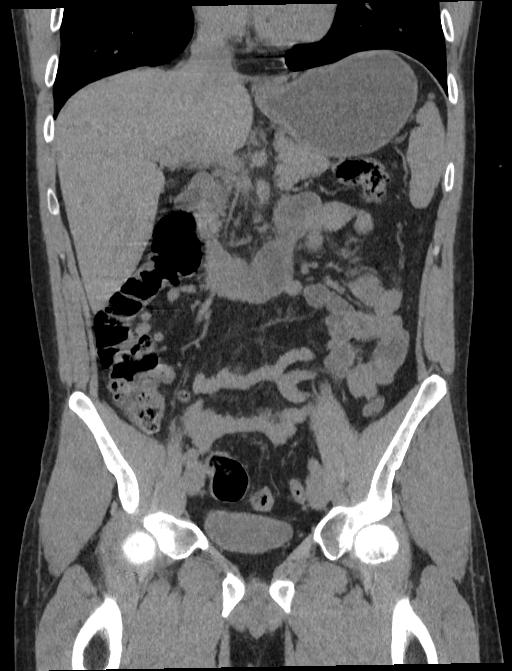
[im 57/128  bone]
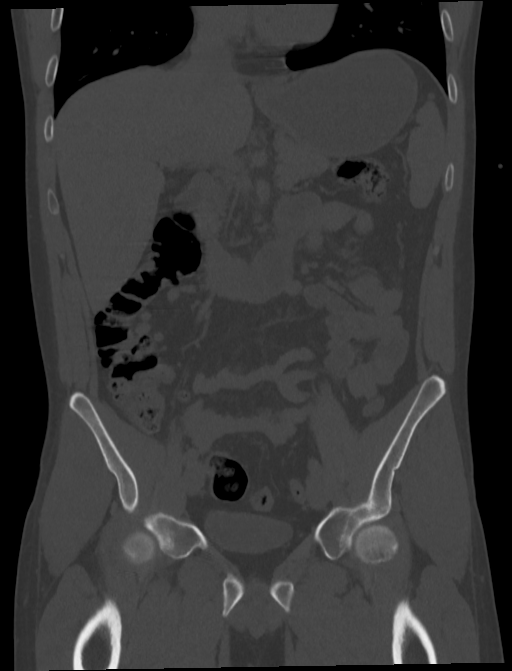
[im 71/128  soft-tissue]
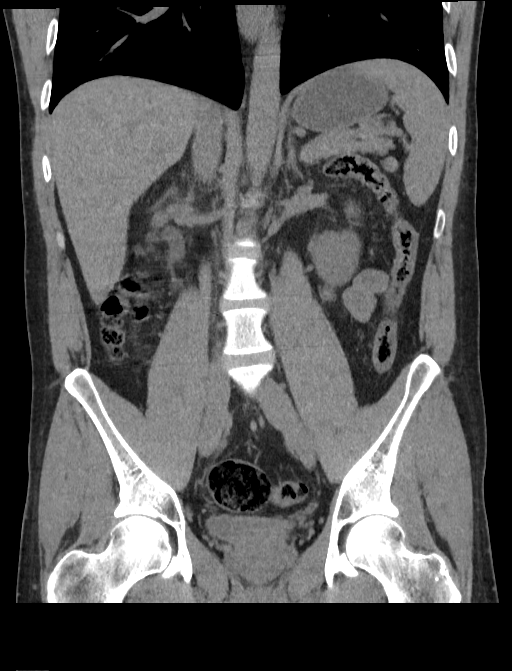

[Series 6: sagittal · sagittal · 0.50mm/px · 1 of 187 slices shown, 2 images]
[im 63/187  soft-tissue]
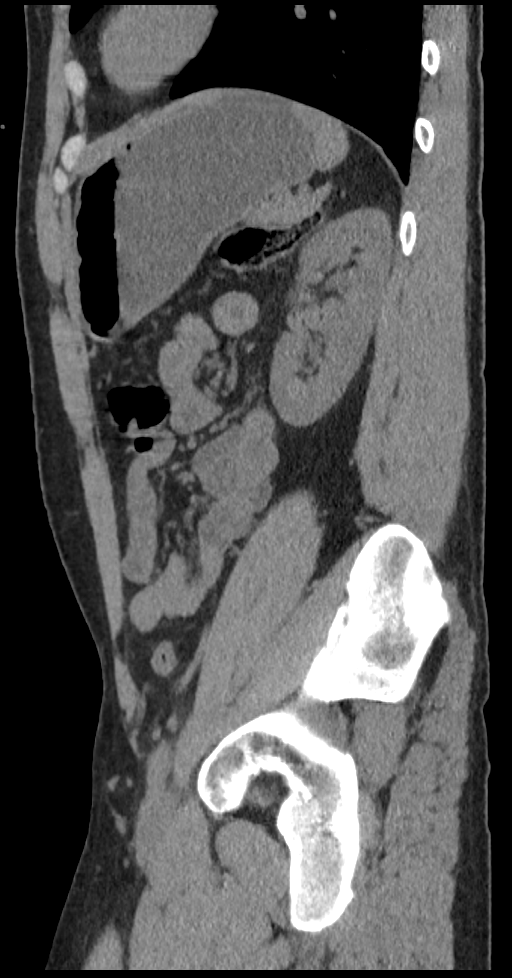
[im 63/187  bone]
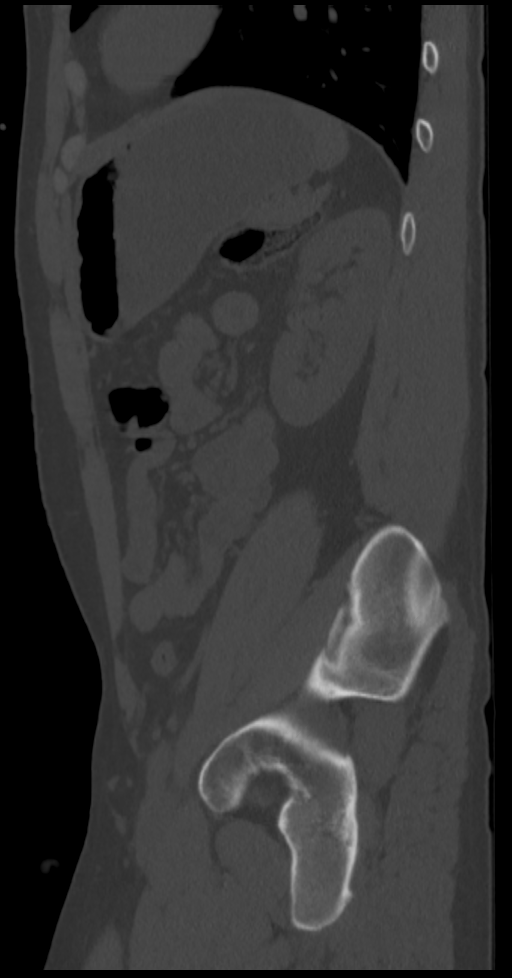

[9 of 46 positions shown; findings below may reference images not displayed]

FINDINGS: Lower chest: No acute abnormality.

Hepatobiliary: No focal liver abnormality is seen. No gallstones,
gallbladder wall thickening, or biliary dilatation.

Pancreas: Unremarkable. No pancreatic ductal dilatation or
surrounding inflammatory changes.

Spleen: Normal in size without focal abnormality.

Adrenals/Urinary Tract: There is right hydronephrosis due to
obstruction by a 4 mm stone in the distal right ureter. Right
perinephric stranding is identified. Small nonobstructing stones
identified in both kidneys. There is no left hydronephrosis. The
bilateral adrenal glands are normal. The bladder is normal.

Stomach/Bowel: Stomach is within normal limits. Appendix appears
normal. No evidence of bowel wall thickening, distention, or
inflammatory changes.

Vascular/Lymphatic: No significant vascular findings are present. No
enlarged abdominal or pelvic lymph nodes.

Reproductive: Prostate is unremarkable.

Other: None.

Musculoskeletal: No acute or significant osseous findings.
IMPRESSION: Right hydronephrosis due to obstruction by a 4 mm stone in the
distal right ureter. The

## 2021-09-29 ENCOUNTER — Other Ambulatory Visit: Payer: Self-pay

## 2021-09-29 ENCOUNTER — Ambulatory Visit
Admission: EM | Admit: 2021-09-29 | Discharge: 2021-09-29 | Disposition: A | Discharge status: Home / Self Care | Payer: Commercial Managed Care - PPO | Attending: Physician Assistant | Admitting: Physician Assistant

## 2021-09-29 DIAGNOSIS — H5789 Other specified disorders of eye and adnexa: Secondary | ICD-10-CM | POA: Diagnosis not present

## 2021-09-29 DIAGNOSIS — S0502XA Injury of conjunctiva and corneal abrasion without foreign body, left eye, initial encounter: Secondary | ICD-10-CM | POA: Diagnosis not present

## 2021-09-29 MED ORDER — MOXIFLOXACIN HCL 0.5 % OP SOLN: 1.0000 [drp] | Freq: Three times a day (TID) | OPHTHALMIC | 0 refills | Status: AC

## 2021-09-29 MED ORDER — KETOROLAC TROMETHAMINE 0.5 % OP SOLN: 1.0000 [drp] | Freq: Four times a day (QID) | OPHTHALMIC | 0 refills | Status: AC

## 2021-09-29 NOTE — ED Provider Notes (Signed)
?[facility] ? ? ? ?CSN: [id number] ?Arrival date & time: 09/29/21  1231 ? ? ?  ? ?History   ?Chief Complaint ?Chief Complaint  ?Patient presents with  ? Eye Pain  ? Eye Drainage  ? ? ?HPI ?Tyler Holmes is a 31 y.o. male presenting for onset of left eye redness and pain since waking up this morning. Denies injury, foreign body. Wears glasses, no contacts. Denies drainage, photophobia, cough, congestion. Has tried saline eye drops OTC which burned.  ? ?HPI ? ?Past Medical History:  ?Diagnosis Date  ? Asthma   ? Kidney stone   ? ? ?There are no problems to display for this patient. ? ? ?Past Surgical History:  ?Procedure Laterality Date  ? NO PAST SURGERIES    ? ? ? ? ? ?Home Medications   ? ?Prior to Admission medications   ?Medication Sig Start Date End Date Taking? Authorizing Provider  ?ketorolac (ACULAR) 0.5 % ophthalmic solution Place 1 drop into the left eye every 6 (six) hours. 09/29/21  Yes Shirlee Latch, PA-C  ?moxifloxacin (VIGAMOX) 0.5 % ophthalmic solution Place 1 drop into the left eye 3 (three) times daily for 7 days. 09/29/21 10/06/21 Yes Shirlee Latch, PA-C  ?albuterol (PROVENTIL HFA;VENTOLIN HFA) 108 (90 Base) MCG/ACT inhaler Inhale 1-2 puffs into the lungs every 6 (six) hours as needed for wheezing or shortness of breath. 08/14/16   Payton Mccallum, MD  ?cyclobenzaprine (FLEXERIL) 10 MG tablet Take 1 tablet (10 mg total) by mouth at bedtime. 07/26/18   Payton Mccallum, MD  ?ibuprofen (ADVIL,MOTRIN) 800 MG tablet Take 1 tablet (800 mg total) by mouth every 8 (eight) hours as needed for moderate pain. 03/11/17   Irean Hong, MD  ?meloxicam (MOBIC) 15 MG tablet Take 1 tablet (15 mg total) by mouth daily. 08/08/18   Payton Mccallum, MD  ?ondansetron (ZOFRAN ODT) 4 MG disintegrating tablet Take 1 tablet (4 mg total) by mouth every 8 (eight) hours as needed for nausea or vomiting. 03/11/17   Irean Hong, MD  ?ondansetron (ZOFRAN) 4 MG tablet Take 1 tablet (4 mg total) by mouth daily as needed for  nausea or vomiting. 01/22/20   Emily Filbert, MD  ?oxyCODONE-acetaminophen (PERCOCET) 5-325 MG tablet Take 1 tablet by mouth every 8 (eight) hours as needed. 01/22/20   Emily Filbert, MD  ?oxyCODONE-acetaminophen (ROXICET) 5-325 MG tablet Take 1 tablet by mouth every 4 (four) hours as needed for severe pain. 03/11/17   Irean Hong, MD  ?tamsulosin (FLOMAX) 0.4 MG CAPS capsule Take 1 capsule (0.4 mg total) by mouth daily. 03/11/17   Irean Hong, MD  ?tamsulosin (FLOMAX) 0.4 MG CAPS capsule Take 1 capsule (0.4 mg total) by mouth daily after breakfast. 01/22/20   Emily Filbert, MD  ? ? ?Family History ?Family History  ?Problem Relation Age of Onset  ? Other Mother   ?     unknown medical hsitory  ? Other Father   ?     unknown medical history  ? ? ?Social History ?Social History  ? ?Tobacco Use  ? Smoking status: Never  ? Smokeless tobacco: Never  ?Vaping Use  ? Vaping Use: Never used  ?Substance Use Topics  ? Alcohol use: No  ? Drug use: Never  ? ? ? ?Allergies   ?Erythromycin ? ? ?Review of Systems ?Review of Systems  ?Constitutional:  Negative for fatigue and fever.  ?HENT:  Negative for congestion, rhinorrhea and sore throat.   ?  Eyes:  Positive for pain, redness and itching. Negative for photophobia, discharge and visual disturbance.  ?Respiratory:  Negative for cough.   ?Gastrointestinal:  Negative for nausea and vomiting.  ?Neurological:  Negative for dizziness, weakness and headaches.  ? ? ?Physical Exam ?Triage Vital Signs ?ED Triage Vitals [09/29/21 1334]  ?Enc Vitals Group  ?   BP 103/61  ?   Pulse Rate (!) 102  ?   Resp 16  ?   Temp 98.1 ?F (36.7 ?C)  ?   Temp Source Oral  ?   SpO2 100 %  ?   Weight   ?   Height   ?   Head Circumference   ?   Peak Flow   ?   Pain Score   ?   Pain Loc   ?   Pain Edu?   ?   Excl. in GC?   ? ?No data found. ? ?Updated Vital Signs ?BP 103/61 (BP Location: Left Arm)   Pulse (!) 102   Temp 98.1 ?F (36.7 ?C) (Oral)   Resp 16   SpO2 100%  ? ?Visual Acuity ?Right  Eye Distance:   ?Left Eye Distance:   ?Bilateral Distance:   ? ?Right Eye Near:   ?Left Eye Near:    ?Bilateral Near:    ? ?Physical Exam ?Vitals and nursing note reviewed.  ?Constitutional:   ?   General: He is not in acute distress. ?   Appearance: Normal appearance. He is well-developed.  ?HENT:  ?   Head: Normocephalic and atraumatic.  ?   Nose: Nose normal.  ?   Mouth/Throat:  ?   Mouth: Mucous membranes are moist.  ?   Pharynx: Oropharynx is clear.  ?Eyes:  ?   General: No scleral icterus.    ?   Left eye: No foreign body or discharge.  ?   Conjunctiva/sclera:  ?   Left eye: Left conjunctiva is injected.  ?   Pupils:  ?   Left eye: Corneal abrasion (small corneal abrasion laterally) present.  ?   Comments: Slight erythema and swelling of the left lower eyelid  ?Cardiovascular:  ?   Rate and Rhythm: Normal rate and regular rhythm.  ?Pulmonary:  ?   Effort: Pulmonary effort is normal. No respiratory distress.  ?Musculoskeletal:  ?   Cervical back: Neck supple.  ?Skin: ?   General: Skin is warm and dry.  ?   Capillary Refill: Capillary refill takes less than 2 seconds.  ?Neurological:  ?   General: No focal deficit present.  ?   Mental Status: He is alert. Mental status is at baseline.  ?   Motor: No weakness.  ?   Coordination: Coordination normal.  ?   Gait: Gait normal.  ?Psychiatric:     ?   Mood and Affect: Mood normal.     ?   Behavior: Behavior normal.     ?   Thought Content: Thought content normal.  ? ? ? ?UC Treatments / Results  ?Labs ?(all labs ordered are listed, but only abnormal results are displayed) ?Labs Reviewed - No data to display ? ?EKG ? ? ?Radiology ?No results found. ? ?Procedures ?Procedures (including critical care time) ? ?Medications Ordered in UC ?Medications - No data to display ? ?Initial Impression / Assessment and Plan / UC Course  ?I have reviewed the triage vital signs and the nursing notes. ? ?Pertinent labs & imaging results that were available during my care of the  patient  were reviewed by me and considered in my medical decision making (see chart for details). ? ? 31 y/o male presenting for left eye redness and pain since waking up this morning.  Denies any injury.  No associated URI symptoms.  No drainage from eye.  No contact with anyone with similar symptoms. ? ?Vision intact.  No red flag signs or symptoms.  Fluorescein eye stain performed today which shows small corneal abrasion laterally.  Will treat with OTC redness relief eyedrops, ketorolac eyedrops for pain and inflammation and Vigamox to try to prevent infection.  Discussed with patient return and ER precautions. ? ? ?Final Clinical Impressions(s) / UC Diagnoses  ? ?Final diagnoses:  ?Abrasion of left cornea, initial encounter  ?Redness of left eye  ? ? ? ?Discharge Instructions   ? ?  ?-Use OTC redness relief drops ?-I sent anti-inflammatory eyedrops that will help with pain.  Also antibiotic eyedrops to try to prevent infection but if you do develop signs of infection including increased redness, drainage or more pain he should be seen again. ?- If you ever have any severe worsening of your eye pain, dizziness, fevers, facial swelling, weakness, go to ER for reevaluation. ? ? ? ? ?ED Prescriptions   ? ? Medication Sig Dispense Auth. Provider  ? ketorolac (ACULAR) 0.5 % ophthalmic solution Place 1 drop into the left eye every 6 (six) hours. 5 mL Shirlee LatchEaves, Aaryn Parrilla B, PA-C  ? moxifloxacin (VIGAMOX) 0.5 % ophthalmic solution Place 1 drop into the left eye 3 (three) times daily for 7 days. 3 mL Shirlee LatchEaves, Beuna Bolding B, PA-C  ? ?  ? ?PDMP not reviewed this encounter. ?  ?Shirlee Latchaves, Sidnee Gambrill B, PA-C ?09/29/21 1424 ? ?

## 2021-09-29 NOTE — ED Notes (Signed)
VISUAL ACUITY ?Right eye corrected 20/30 ?Left eye corrected 20/25 ?Both eyes corrected 20/25 ?

## 2021-09-29 NOTE — Discharge Instructions (Addendum)
-  Use OTC redness relief drops ?-I sent anti-inflammatory eyedrops that will help with pain.  Also antibiotic eyedrops to try to prevent infection but if you do develop signs of infection including increased redness, drainage or more pain he should be seen again. ?- If you ever have any severe worsening of your eye pain, dizziness, fevers, facial swelling, weakness, go to ER for reevaluation. ?

## 2021-09-29 NOTE — ED Triage Notes (Signed)
Pt presents to the office for left eye pain and drainage that started this morning. ?

## 2022-08-05 ENCOUNTER — Ambulatory Visit
Admission: EM | Admit: 2022-08-05 | Discharge: 2022-08-05 | Disposition: A | Discharge status: Home / Self Care | Payer: Commercial Managed Care - PPO | Attending: Emergency Medicine | Admitting: Emergency Medicine

## 2022-08-05 DIAGNOSIS — H66001 Acute suppurative otitis media without spontaneous rupture of ear drum, right ear: Secondary | ICD-10-CM

## 2022-08-05 MED ORDER — AMOXICILLIN-POT CLAVULANATE 875-125 MG PO TABS: 1.0000 | ORAL_TABLET | Freq: Two times a day (BID) | ORAL | 0 refills | Status: AC

## 2022-08-05 NOTE — Discharge Instructions (Signed)
Take the Augmentin twice daily for 10 days with food for treatment of your ear infection.  Take an over-the-counter probiotic 1 hour after each dose of antibiotic to prevent diarrhea.  Use over-the-counter Tylenol and ibuprofen as needed for pain or fever.  Place a hot water bottle, or heating pad, underneath your pillowcase at night to help dilate up your ear and aid in pain relief as well as resolution of the infection.  Return for reevaluation for any new or worsening symptoms.  

## 2022-08-05 NOTE — ED Triage Notes (Signed)
Pt c/o R ear fullness & muffled hearing x7 days. Denies any fevers,cough or HA's.

## 2022-08-05 NOTE — ED Provider Notes (Signed)
[facility]    CSN: [id number] Arrival date & time: 08/05/22  6301      History   Chief Complaint Chief Complaint  Patient presents with   Ear Fullness    HPI Tyler Holmes is a 32 y.o. male.   HPI  32 year old male here for evaluation of right ear fullness.  The patient reports that for last 7 days he has been experiencing fullness and muffled hearing in his right ear.  This has not been associate with any fever, runny nose and nasal congestion, sore throat, cough, or ringing in his ear.  Past Medical History:  Diagnosis Date   Asthma    Kidney stone     There are no problems to display for this patient.   Past Surgical History:  Procedure Laterality Date   NO PAST SURGERIES         Home Medications    Prior to Admission medications   Medication Sig Start Date End Date Taking? Authorizing Provider  albuterol (PROVENTIL HFA;VENTOLIN HFA) 108 (90 Base) MCG/ACT inhaler Inhale 1-2 puffs into the lungs every 6 (six) hours as needed for wheezing or shortness of breath. 08/14/16  Yes Norval Gable, MD  amoxicillin-clavulanate (AUGMENTIN) 875-125 MG tablet Take 1 tablet by mouth every 12 (twelve) hours for 10 days. 08/05/22 08/15/22 Yes Margarette Canada, NP  cyclobenzaprine (FLEXERIL) 10 MG tablet Take 1 tablet (10 mg total) by mouth at bedtime. 07/26/18  Yes Norval Gable, MD  ibuprofen (ADVIL,MOTRIN) 800 MG tablet Take 1 tablet (800 mg total) by mouth every 8 (eight) hours as needed for moderate pain. 03/11/17  Yes Paulette Blanch, MD  ketorolac (ACULAR) 0.5 % ophthalmic solution Place 1 drop into the left eye every 6 (six) hours. 09/29/21  Yes Danton Clap, PA-C  meloxicam (MOBIC) 15 MG tablet Take 1 tablet (15 mg total) by mouth daily. 08/08/18  Yes Norval Gable, MD  ondansetron (ZOFRAN ODT) 4 MG disintegrating tablet Take 1 tablet (4 mg total) by mouth every 8 (eight) hours as needed for nausea or vomiting. 03/11/17  Yes Paulette Blanch, MD  ondansetron (ZOFRAN) 4  MG tablet Take 1 tablet (4 mg total) by mouth daily as needed for nausea or vomiting. 01/22/20  Yes Earleen Newport, MD  oxyCODONE-acetaminophen (PERCOCET) 5-325 MG tablet Take 1 tablet by mouth every 8 (eight) hours as needed. 01/22/20  Yes Earleen Newport, MD  oxyCODONE-acetaminophen (ROXICET) 5-325 MG tablet Take 1 tablet by mouth every 4 (four) hours as needed for severe pain. 03/11/17  Yes Paulette Blanch, MD  tamsulosin (FLOMAX) 0.4 MG CAPS capsule Take 1 capsule (0.4 mg total) by mouth daily. 03/11/17  Yes Paulette Blanch, MD  tamsulosin (FLOMAX) 0.4 MG CAPS capsule Take 1 capsule (0.4 mg total) by mouth daily after breakfast. 01/22/20  Yes Earleen Newport, MD    Family History Family History  Problem Relation Age of Onset   Other Mother        unknown medical hsitory   Other Father        unknown medical history    Social History Social History   Tobacco Use   Smoking status: Never   Smokeless tobacco: Never  Vaping Use   Vaping Use: Never used  Substance Use Topics   Alcohol use: No   Drug use: Never     Allergies   Erythromycin   Review of Systems Review of Systems  Constitutional:  Negative for fever.  HENT:  Positive  for hearing loss. Negative for congestion, ear discharge, ear pain, sore throat and tinnitus.   Respiratory:  Negative for cough.      Physical Exam Triage Vital Signs ED Triage Vitals  Enc Vitals Group     BP 08/05/22 1004 109/72     Pulse Rate 08/05/22 1004 68     Resp 08/05/22 1004 16     Temp 08/05/22 1004 98.2 F (36.8 C)     Temp Source 08/05/22 1004 Oral     SpO2 08/05/22 1004 98 %     Weight 08/05/22 1003 170 lb (77.1 kg)     Height 08/05/22 1003 6\' 1"  (1.854 m)     Head Circumference --      Peak Flow --      Pain Score 08/05/22 1003 0     Pain Loc --      Pain Edu? --      Excl. in GC? --    No data found.  Updated Vital Signs BP 109/72 (BP Location: Left Arm)   Pulse 68   Temp 98.2 F (36.8 C) (Oral)   Resp 16    Ht 6\' 1"  (1.854 m)   Wt 170 lb (77.1 kg)   SpO2 98%   BMI 22.43 kg/m   Visual Acuity Right Eye Distance:   Left Eye Distance:   Bilateral Distance:    Right Eye Near:   Left Eye Near:    Bilateral Near:     Physical Exam Vitals and nursing note reviewed.  Constitutional:      Appearance: Normal appearance. He is not ill-appearing.  HENT:     Head: Normocephalic and atraumatic.     Right Ear: Ear canal and external ear normal. There is no impacted cerumen.     Left Ear: Tympanic membrane, ear canal and external ear normal. There is no impacted cerumen.     Ears:     Comments: Patient has yellow pus behind the right TM.  The TM itself is mildly erythematous and injected.  EACs clear.  Left EAC and TM are normal in appearance. Cardiovascular:     Rate and Rhythm: Normal rate and regular rhythm.     Pulses: Normal pulses.     Heart sounds: Normal heart sounds. No murmur heard.    No friction rub. No gallop.  Pulmonary:     Effort: Pulmonary effort is normal.     Breath sounds: Normal breath sounds. No wheezing, rhonchi or rales.  Skin:    General: Skin is warm and dry.     Capillary Refill: Capillary refill takes less than 2 seconds.     Findings: No erythema or rash.  Neurological:     General: No focal deficit present.     Mental Status: He is alert and oriented to person, place, and time.  Psychiatric:        Mood and Affect: Mood normal.        Behavior: Behavior normal.        Thought Content: Thought content normal.        Judgment: Judgment normal.      UC Treatments / Results  Labs (all labs ordered are listed, but only abnormal results are displayed) Labs Reviewed - No data to display  EKG   Radiology No results found.  Procedures Procedures (including critical care time)  Medications Ordered in UC Medications - No data to display  Initial Impression / Assessment and Plan / UC Course  I have  reviewed the triage vital signs and the nursing  notes.  Pertinent labs & imaging results that were available during my care of the patient were reviewed by me and considered in my medical decision making (see chart for details).   Patient is a pleasant, nontoxic-appearing 32 year old here for evaluation of 7 days worth of fullness and muffled hearing in the right ear.  This is not associate with any fever or upper respiratory symptoms.  He denies any drainage from the ear.  He also denies any ringing or dizziness.  On exam the patient has a significant infection with yellow pus behind the tympanic membrane that obscures any view of the middle ear.  There is mild erythema and injection to the tympanic membrane itself but the EAC is clear.  Left TM is pearly gray in appearance with normal light reflex and clear EAC.  Cardiopulmonary exam is benign.  I will treat the patient for otitis media with a 10-day course of Augmentin 875 twice daily with food.  Home care and return precautions reviewed.  Patient   Final Clinical Impressions(s) / UC Diagnoses   Final diagnoses:  Non-recurrent acute suppurative otitis media of right ear without spontaneous rupture of tympanic membrane     Discharge Instructions      Take the Augmentin twice daily for 10 days with food for treatment of your ear infection.  Take an over-the-counter probiotic 1 hour after each dose of antibiotic to prevent diarrhea.  Use over-the-counter Tylenol and ibuprofen as needed for pain or fever.  Place a hot water bottle, or heating pad, underneath your pillowcase at night to help dilate up your ear and aid in pain relief as well as resolution of the infection.  Return for reevaluation for any new or worsening symptoms.      ED Prescriptions     Medication Sig Dispense Auth. Provider   amoxicillin-clavulanate (AUGMENTIN) 875-125 MG tablet Take 1 tablet by mouth every 12 (twelve) hours for 10 days. 20 tablet Margarette Canada, NP      PDMP not reviewed this encounter.    Margarette Canada, NP 08/05/22 1019

## 2022-08-24 ENCOUNTER — Encounter: Payer: Self-pay | Admitting: Emergency Medicine

## 2022-08-24 ENCOUNTER — Ambulatory Visit
Admission: EM | Admit: 2022-08-24 | Discharge: 2022-08-24 | Disposition: A | Discharge status: Home / Self Care | Payer: Commercial Managed Care - PPO | Attending: Emergency Medicine | Admitting: Emergency Medicine

## 2022-08-24 DIAGNOSIS — H66003 Acute suppurative otitis media without spontaneous rupture of ear drum, bilateral: Secondary | ICD-10-CM | POA: Diagnosis not present

## 2022-08-24 MED ORDER — CEFDINIR 300 MG PO CAPS: 300.0000 mg | ORAL_CAPSULE | Freq: Two times a day (BID) | ORAL | 0 refills | Status: AC

## 2022-08-24 MED ORDER — AMOXICILLIN-POT CLAVULANATE 875-125 MG PO TABS: 1.0000 | ORAL_TABLET | Freq: Two times a day (BID) | ORAL | 0 refills | Status: DC

## 2022-08-24 NOTE — ED Provider Notes (Signed)
[facility]    CSN: [id number] Arrival date & time: 08/24/22  7829      History   Chief Complaint Chief Complaint  Patient presents with   Ear Fullness    HPI Tyler Holmes is a 32 y.o. male.   HPI  71 old male here for evaluation of right ear complaint.  The patient reports that he has been experiencing a feeling of fullness in his right ear for the past month.  There is occasional pain and ringing in the ear but nothing at present.  He denies any drainage from the ear, runny nose, or nasal congestion.  He was evaluated in this urgent care on 08/05/2021 and placed on a 10-day course of Augmentin without any improvement of his symptoms.  Past Medical History:  Diagnosis Date   Asthma    Kidney stone     There are no problems to display for this patient.   Past Surgical History:  Procedure Laterality Date   NO PAST SURGERIES         Home Medications    Prior to Admission medications   Medication Sig Start Date End Date Taking? Authorizing Provider  cefdinir (OMNICEF) 300 MG capsule Take 1 capsule (300 mg total) by mouth 2 (two) times daily for 10 days. 08/24/22 09/03/22 Yes Margarette Canada, NP  albuterol (PROVENTIL HFA;VENTOLIN HFA) 108 (90 Base) MCG/ACT inhaler Inhale 1-2 puffs into the lungs every 6 (six) hours as needed for wheezing or shortness of breath. 08/14/16   Norval Gable, MD  cyclobenzaprine (FLEXERIL) 10 MG tablet Take 1 tablet (10 mg total) by mouth at bedtime. 07/26/18   Norval Gable, MD  ibuprofen (ADVIL,MOTRIN) 800 MG tablet Take 1 tablet (800 mg total) by mouth every 8 (eight) hours as needed for moderate pain. 03/11/17   Paulette Blanch, MD  ketorolac (ACULAR) 0.5 % ophthalmic solution Place 1 drop into the left eye every 6 (six) hours. 09/29/21   Danton Clap, PA-C  meloxicam (MOBIC) 15 MG tablet Take 1 tablet (15 mg total) by mouth daily. 08/08/18   Norval Gable, MD  ondansetron (ZOFRAN ODT) 4 MG disintegrating tablet Take 1 tablet (4 mg  total) by mouth every 8 (eight) hours as needed for nausea or vomiting. 03/11/17   Paulette Blanch, MD  ondansetron (ZOFRAN) 4 MG tablet Take 1 tablet (4 mg total) by mouth daily as needed for nausea or vomiting. 01/22/20   Earleen Newport, MD  oxyCODONE-acetaminophen (PERCOCET) 5-325 MG tablet Take 1 tablet by mouth every 8 (eight) hours as needed. 01/22/20   Earleen Newport, MD  oxyCODONE-acetaminophen (ROXICET) 5-325 MG tablet Take 1 tablet by mouth every 4 (four) hours as needed for severe pain. 03/11/17   Paulette Blanch, MD  tamsulosin (FLOMAX) 0.4 MG CAPS capsule Take 1 capsule (0.4 mg total) by mouth daily. 03/11/17   Paulette Blanch, MD  tamsulosin [facility]) 0.4 MG CAPS capsule Take 1 capsule (0.4 mg total) by mouth daily after breakfast. 01/22/20   Earleen Newport, MD    Family History Family History  Problem Relation Age of Onset   Other Mother        unknown medical hsitory   Other Father        unknown medical history    Social History Social History   Tobacco Use   Smoking status: Never   Smokeless tobacco: Never  Vaping Use   Vaping Use: Never used  Substance Use Topics   Alcohol  use: No   Drug use: Never     Allergies   Erythromycin   Review of Systems Review of Systems  Constitutional:  Negative for fever.  HENT:  Positive for ear pain, hearing loss and tinnitus. Negative for congestion, ear discharge and rhinorrhea.      Physical Exam Triage Vital Signs ED Triage Vitals  Enc Vitals Group     BP 08/24/22 0842 119/79     Pulse Rate 08/24/22 0842 73     Resp 08/24/22 0842 16     Temp 08/24/22 0842 98.7 F (37.1 C)     Temp Source 08/24/22 0842 Oral     SpO2 08/24/22 0842 100 %     Weight --      Height --      Head Circumference --      Peak Flow --      Pain Score 08/24/22 0840 0     Pain Loc --      Pain Edu? --      Excl. in [place]? --    No data found.  Updated Vital Signs BP 119/79 (BP Location: Left Arm)   Pulse 73   Temp 98.7 F (37.1  C) (Oral)   Resp 16   SpO2 100%   Visual Acuity Right Eye Distance:   Left Eye Distance:   Bilateral Distance:    Right Eye Near:   Left Eye Near:    Bilateral Near:     Physical Exam Vitals and nursing note reviewed.  Constitutional:      Appearance: Normal appearance. He is not ill-appearing.  HENT:     Head: Normocephalic and atraumatic.     Right Ear: Ear canal and external ear normal. There is no impacted cerumen.     Left Ear: Ear canal and external ear normal. There is no impacted cerumen.     Ears:     Comments: Tympanic membrane's are erythematous and injected with yellow fluid present behind the tympanic membrane.  External auditory canals are clear. Skin:    General: Skin is warm and dry.     Capillary Refill: Capillary refill takes less than 2 seconds.     Findings: No erythema or rash.  Neurological:     General: No focal deficit present.     Mental Status: He is alert and oriented to person, place, and time.  Psychiatric:        Mood and Affect: Mood normal.        Behavior: Behavior normal.        Thought Content: Thought content normal.        Judgment: Judgment normal.      UC Treatments / Results  Labs (all labs ordered are listed, but only abnormal results are displayed) Labs Reviewed - No data to display  EKG   Radiology No results found.  Procedures Procedures (including critical care time)  Medications Ordered in UC Medications - No data to display  Initial Impression / Assessment and Plan / UC Course  I have reviewed the triage vital signs and the nursing notes.  Pertinent labs & imaging results that were available during my care of the patient were reviewed by me and considered in my medical decision making (see chart for details).   Patient is a nontoxic-appearing 32 year old male here for evaluation of 1 month worth of right ear fullness with occasional pain and ringing.  The pain and really are not present currently.  On exam he  does  have yellow effusions behind both tympanic membranes and both tympanic membranes are erythematous and injected.  He was seen here on 08/05/2021 and had a similar presentation.  He was treated with a 10-day course of Augmentin without any improvement of his symptoms.  Given the fact that he has a recurrent bilateral otitis media despite antibiotic therapy I will order a 10-day course of cefdinir 300 mg twice daily and refer him to [facility] for evaluation.   Final Clinical Impressions(s) / UC Diagnoses   Final diagnoses:  Non-recurrent acute suppurative otitis media of both ears without spontaneous rupture of tympanic membranes     Discharge Instructions      Take the Augmentin twice daily for 10 days with food for treatment of your ear infection.  Take an over-the-counter probiotic 1 hour after each dose of antibiotic to prevent diarrhea.  Use over-the-counter Tylenol and ibuprofen as needed for pain or fever.  Place a hot water bottle, or heating pad, underneath your pillowcase at night to help dilate up your ear and aid in pain relief as well as resolution of the infection.  Return for reevaluation for any new or worsening symptoms.      ED Prescriptions     Medication Sig Dispense Auth. Provider   amoxicillin-clavulanate (AUGMENTIN) 875-125 MG tablet  (Status: Discontinued) Take 1 tablet by mouth every 12 (twelve) hours for 10 days. 20 tablet Margarette Canada, NP   cefdinir (OMNICEF) 300 MG capsule Take 1 capsule (300 mg total) by mouth 2 (two) times daily for 10 days. 20 capsule Margarette Canada, NP      PDMP not reviewed this encounter.   Margarette Canada, NP 08/24/22 [phone]

## 2022-08-24 NOTE — ED Triage Notes (Signed)
Pt c/o right ear feeling clogged and occasional pain x 1 month

## 2022-08-24 NOTE — Discharge Instructions (Addendum)
Take the Augmentin twice daily for 10 days with food for treatment of your ear infection.  Take an over-the-counter probiotic 1 hour after each dose of antibiotic to prevent diarrhea.  Use over-the-counter Tylenol and ibuprofen as needed for pain or fever.  Place a hot water bottle, or heating pad, underneath your pillowcase at night to help dilate up your ear and aid in pain relief as well as resolution of the infection.  Return for reevaluation for any new or worsening symptoms.
